# Patient Record
Sex: Male | Born: 2005 | Race: White | Hispanic: No | Marital: Single | Smoking: Never smoker
Health system: Southern US, Community
[De-identification: ages and names within clinical notes are randomized; demographics above are authoritative.]

---
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
## 2024-01-17 DIAGNOSIS — M25521 Pain in right elbow: Secondary | ICD-10-CM | POA: Diagnosis not present

## 2024-01-22 DIAGNOSIS — M25521 Pain in right elbow: Secondary | ICD-10-CM | POA: Diagnosis not present

## 2024-02-25 DIAGNOSIS — S53441A Ulnar collateral ligament sprain of right elbow, initial encounter: Secondary | ICD-10-CM | POA: Diagnosis not present

## 2024-03-18 DIAGNOSIS — M25421 Effusion, right elbow: Secondary | ICD-10-CM | POA: Diagnosis not present

## 2024-03-18 DIAGNOSIS — S53441A Ulnar collateral ligament sprain of right elbow, initial encounter: Secondary | ICD-10-CM | POA: Diagnosis not present

## 2024-03-18 DIAGNOSIS — M25521 Pain in right elbow: Secondary | ICD-10-CM | POA: Diagnosis not present

## 2024-03-19 DIAGNOSIS — G5621 Lesion of ulnar nerve, right upper limb: Secondary | ICD-10-CM | POA: Diagnosis not present

## 2024-03-19 DIAGNOSIS — S53441D Ulnar collateral ligament sprain of right elbow, subsequent encounter: Secondary | ICD-10-CM | POA: Diagnosis not present

## 2024-03-25 DIAGNOSIS — D225 Melanocytic nevi of trunk: Secondary | ICD-10-CM | POA: Diagnosis not present

## 2024-03-25 DIAGNOSIS — Z1283 Encounter for screening for malignant neoplasm of skin: Secondary | ICD-10-CM | POA: Diagnosis not present

## 2024-03-31 DIAGNOSIS — F411 Generalized anxiety disorder: Secondary | ICD-10-CM | POA: Diagnosis not present

## 2024-03-31 DIAGNOSIS — F902 Attention-deficit hyperactivity disorder, combined type: Secondary | ICD-10-CM | POA: Diagnosis not present

## 2024-04-11 DIAGNOSIS — G5621 Lesion of ulnar nerve, right upper limb: Secondary | ICD-10-CM | POA: Diagnosis not present

## 2024-04-11 DIAGNOSIS — S53441D Ulnar collateral ligament sprain of right elbow, subsequent encounter: Secondary | ICD-10-CM | POA: Diagnosis not present

## 2024-04-11 DIAGNOSIS — S5331XA Traumatic rupture of right ulnar collateral ligament, initial encounter: Secondary | ICD-10-CM | POA: Diagnosis not present

## 2024-04-11 DIAGNOSIS — G8918 Other acute postprocedural pain: Secondary | ICD-10-CM | POA: Diagnosis not present

## 2024-04-11 DIAGNOSIS — S53441A Ulnar collateral ligament sprain of right elbow, initial encounter: Secondary | ICD-10-CM | POA: Diagnosis not present

## 2024-04-22 DIAGNOSIS — M25521 Pain in right elbow: Secondary | ICD-10-CM | POA: Diagnosis not present

## 2024-04-23 DIAGNOSIS — Z4889 Encounter for other specified surgical aftercare: Secondary | ICD-10-CM | POA: Diagnosis not present

## 2024-04-29 DIAGNOSIS — M25521 Pain in right elbow: Secondary | ICD-10-CM | POA: Diagnosis not present

## 2024-05-07 DIAGNOSIS — M25521 Pain in right elbow: Secondary | ICD-10-CM | POA: Diagnosis not present

## 2024-05-13 DIAGNOSIS — M25521 Pain in right elbow: Secondary | ICD-10-CM | POA: Diagnosis not present

## 2024-05-20 DIAGNOSIS — M25521 Pain in right elbow: Secondary | ICD-10-CM | POA: Diagnosis not present

## 2024-05-27 DIAGNOSIS — M25521 Pain in right elbow: Secondary | ICD-10-CM | POA: Diagnosis not present

## 2024-06-10 DIAGNOSIS — D225 Melanocytic nevi of trunk: Secondary | ICD-10-CM | POA: Diagnosis not present

## 2024-06-10 DIAGNOSIS — D2271 Melanocytic nevi of right lower limb, including hip: Secondary | ICD-10-CM | POA: Diagnosis not present

## 2024-06-10 DIAGNOSIS — D485 Neoplasm of uncertain behavior of skin: Secondary | ICD-10-CM | POA: Diagnosis not present

## 2024-06-10 DIAGNOSIS — D2272 Melanocytic nevi of left lower limb, including hip: Secondary | ICD-10-CM | POA: Diagnosis not present

## 2024-06-13 DIAGNOSIS — M25521 Pain in right elbow: Secondary | ICD-10-CM | POA: Diagnosis not present

## 2024-06-16 DIAGNOSIS — M25521 Pain in right elbow: Secondary | ICD-10-CM | POA: Diagnosis not present

## 2024-06-18 DIAGNOSIS — M25521 Pain in right elbow: Secondary | ICD-10-CM | POA: Diagnosis not present

## 2024-06-23 DIAGNOSIS — M25521 Pain in right elbow: Secondary | ICD-10-CM | POA: Diagnosis not present

## 2024-06-27 DIAGNOSIS — M25521 Pain in right elbow: Secondary | ICD-10-CM | POA: Diagnosis not present

## 2024-07-01 DIAGNOSIS — F902 Attention-deficit hyperactivity disorder, combined type: Secondary | ICD-10-CM | POA: Diagnosis not present

## 2024-07-01 DIAGNOSIS — F411 Generalized anxiety disorder: Secondary | ICD-10-CM | POA: Diagnosis not present

## 2024-07-02 DIAGNOSIS — M25521 Pain in right elbow: Secondary | ICD-10-CM | POA: Diagnosis not present

## 2024-07-04 DIAGNOSIS — L905 Scar conditions and fibrosis of skin: Secondary | ICD-10-CM | POA: Diagnosis not present

## 2024-07-04 DIAGNOSIS — L988 Other specified disorders of the skin and subcutaneous tissue: Secondary | ICD-10-CM | POA: Diagnosis not present

## 2024-07-04 DIAGNOSIS — D485 Neoplasm of uncertain behavior of skin: Secondary | ICD-10-CM | POA: Diagnosis not present

## 2024-07-07 DIAGNOSIS — M25521 Pain in right elbow: Secondary | ICD-10-CM | POA: Diagnosis not present

## 2024-07-11 DIAGNOSIS — M25521 Pain in right elbow: Secondary | ICD-10-CM | POA: Diagnosis not present

## 2024-07-16 DIAGNOSIS — M25521 Pain in right elbow: Secondary | ICD-10-CM | POA: Diagnosis not present

## 2024-07-22 DIAGNOSIS — M25521 Pain in right elbow: Secondary | ICD-10-CM | POA: Diagnosis not present

## 2024-07-25 DIAGNOSIS — M25521 Pain in right elbow: Secondary | ICD-10-CM | POA: Diagnosis not present

## 2024-07-28 DIAGNOSIS — M25521 Pain in right elbow: Secondary | ICD-10-CM | POA: Diagnosis not present

## 2024-07-30 DIAGNOSIS — M25521 Pain in right elbow: Secondary | ICD-10-CM | POA: Diagnosis not present

## 2024-08-06 DIAGNOSIS — M25521 Pain in right elbow: Secondary | ICD-10-CM | POA: Diagnosis not present

## 2024-08-08 DIAGNOSIS — M25521 Pain in right elbow: Secondary | ICD-10-CM | POA: Diagnosis not present

## 2024-08-11 DIAGNOSIS — M25521 Pain in right elbow: Secondary | ICD-10-CM | POA: Diagnosis not present

## 2024-08-15 DIAGNOSIS — M25521 Pain in right elbow: Secondary | ICD-10-CM | POA: Diagnosis not present

## 2024-08-18 DIAGNOSIS — M25521 Pain in right elbow: Secondary | ICD-10-CM | POA: Diagnosis not present

## 2024-08-20 DIAGNOSIS — Z4889 Encounter for other specified surgical aftercare: Secondary | ICD-10-CM | POA: Diagnosis not present

## 2024-08-22 DIAGNOSIS — M25521 Pain in right elbow: Secondary | ICD-10-CM | POA: Diagnosis not present

## 2024-08-25 DIAGNOSIS — M25521 Pain in right elbow: Secondary | ICD-10-CM | POA: Diagnosis not present

## 2024-08-29 DIAGNOSIS — M25521 Pain in right elbow: Secondary | ICD-10-CM | POA: Diagnosis not present

## 2024-09-01 DIAGNOSIS — M25521 Pain in right elbow: Secondary | ICD-10-CM | POA: Diagnosis not present

## 2024-09-03 DIAGNOSIS — M25521 Pain in right elbow: Secondary | ICD-10-CM | POA: Diagnosis not present

## 2024-09-08 DIAGNOSIS — M25521 Pain in right elbow: Secondary | ICD-10-CM | POA: Diagnosis not present

## 2024-09-11 DIAGNOSIS — M25521 Pain in right elbow: Secondary | ICD-10-CM | POA: Diagnosis not present

## 2024-09-15 DIAGNOSIS — M25521 Pain in right elbow: Secondary | ICD-10-CM | POA: Diagnosis not present

## 2024-09-17 DIAGNOSIS — M25521 Pain in right elbow: Secondary | ICD-10-CM | POA: Diagnosis not present

## 2024-09-22 DIAGNOSIS — M25521 Pain in right elbow: Secondary | ICD-10-CM | POA: Diagnosis not present

## 2024-09-29 DIAGNOSIS — M25521 Pain in right elbow: Secondary | ICD-10-CM | POA: Diagnosis not present

## 2024-09-30 DIAGNOSIS — F411 Generalized anxiety disorder: Secondary | ICD-10-CM | POA: Diagnosis not present

## 2024-09-30 DIAGNOSIS — F902 Attention-deficit hyperactivity disorder, combined type: Secondary | ICD-10-CM | POA: Diagnosis not present

## 2024-10-01 DIAGNOSIS — M25521 Pain in right elbow: Secondary | ICD-10-CM | POA: Diagnosis not present

## 2024-10-06 DIAGNOSIS — M25521 Pain in right elbow: Secondary | ICD-10-CM | POA: Diagnosis not present

## 2024-10-08 DIAGNOSIS — M25521 Pain in right elbow: Secondary | ICD-10-CM | POA: Diagnosis not present

## 2024-10-16 DIAGNOSIS — M25521 Pain in right elbow: Secondary | ICD-10-CM | POA: Diagnosis not present

## 2024-10-20 DIAGNOSIS — M25521 Pain in right elbow: Secondary | ICD-10-CM | POA: Diagnosis not present

## 2024-10-21 DIAGNOSIS — Z1283 Encounter for screening for malignant neoplasm of skin: Secondary | ICD-10-CM | POA: Diagnosis not present

## 2024-10-21 DIAGNOSIS — D225 Melanocytic nevi of trunk: Secondary | ICD-10-CM | POA: Diagnosis not present

## 2024-10-21 DIAGNOSIS — L218 Other seborrheic dermatitis: Secondary | ICD-10-CM | POA: Diagnosis not present
